# Patient Record
Sex: Female | Born: 1980 | Hispanic: No | Marital: Married | Smoking: Current every day smoker
Health system: Southern US, Community
[De-identification: ages and names within clinical notes are randomized; demographics above are authoritative.]

---

## 2013-04-20 ENCOUNTER — Encounter: Payer: Self-pay | Admitting: Emergency Medicine

## 2013-04-20 ENCOUNTER — Emergency Department (INDEPENDENT_AMBULATORY_CARE_PROVIDER_SITE_OTHER)
Admission: EM | Admit: 2013-04-20 | Discharge: 2013-04-20 | Disposition: A | Discharge status: Home / Self Care | Payer: Managed Care, Other (non HMO) | Source: Home / Self Care | Attending: Emergency Medicine | Admitting: Emergency Medicine

## 2013-04-20 ENCOUNTER — Emergency Department (INDEPENDENT_AMBULATORY_CARE_PROVIDER_SITE_OTHER): Payer: Managed Care, Other (non HMO)

## 2013-04-20 DIAGNOSIS — S63509A Unspecified sprain of unspecified wrist, initial encounter: Secondary | ICD-10-CM

## 2013-04-20 DIAGNOSIS — S60219A Contusion of unspecified wrist, initial encounter: Secondary | ICD-10-CM

## 2013-04-20 DIAGNOSIS — S60212A Contusion of left wrist, initial encounter: Secondary | ICD-10-CM

## 2013-04-20 DIAGNOSIS — M25539 Pain in unspecified wrist: Secondary | ICD-10-CM

## 2013-04-20 DIAGNOSIS — S63502A Unspecified sprain of left wrist, initial encounter: Secondary | ICD-10-CM

## 2013-04-20 NOTE — ED Notes (Signed)
Martha HugerLana reports she fell down steps today and injured her left arm and wrist. The pain is a sharp pain and with movement it is a 7/10.

## 2013-04-20 NOTE — ED Provider Notes (Signed)
CSN: [id number]     Arrival date & time 04/20/13  1551 History   First MD Initiated Contact with Patient 04/20/13 1705     Chief Complaint  Patient presents with  . Arm Injury    today  . Wrist Pain    today   (Consider location/radiation/quality/duration/timing/severity/associated sxs/prior Treatment) Patient is a 33 y.o. female presenting with arm injury and wrist pain. The history is provided by the patient.  Arm Injury Location:  Wrist Time since incident:  6 hours Injury: yes   Mechanism of injury: fall   Fall:    Fall occurred:  Tripped   Height of fall:  4 feet   Impact surface:  Hard floor   Point of impact:  Unable to specify   Entrapped after fall: no   Wrist location:  L wrist Pain details:    Quality:  Sharp   Radiates to:  Does not radiate   Severity:  Severe (7/10)   Onset quality:  Sudden   Timing:  Unable to specify   Progression:  Waxing and waning Chronicity:  New Foreign body present: No open wound. Relieved by:  Rest Worsened by:  Movement Associated symptoms: decreased range of motion and swelling   Associated symptoms: no back pain, no fever, no muscle weakness, no neck pain, no numbness, no stiffness and no tingling   Risk factors: no concern for non-accidental trauma, no known bone disorder and no recent illness   Wrist Pain    History reviewed. No pertinent past medical history. History reviewed. No pertinent past surgical history. History reviewed. No pertinent family history. History  Substance Use Topics  . Smoking status: Current Every Day Smoker -- 0.50 packs/day    Types: Cigarettes  . Smokeless tobacco: Never Used  . Alcohol Use: No   OB History   Grav Para Term Preterm Abortions TAB SAB Ect Mult Living                 Review of Systems  Constitutional: Negative for fever.  Musculoskeletal: Negative for back pain, neck pain and stiffness.    Allergies  Review of patient's allergies indicates no known allergies.  Home  Medications  No current outpatient prescriptions on file. BP 98/66  Pulse 68  Temp(Src) 98.4 F (36.9 C) (Oral)  Ht 5\' 4"  (1.626 m)  Wt 144 lb (65.318 kg)  BMI 24.71 kg/m2  SpO2 100%  LMP 04/09/2013 Physical Exam  Nursing note and vitals reviewed. Constitutional: She is oriented to person, place, and time. She appears well-developed and well-nourished. No distress.  HENT:  Head: Normocephalic and atraumatic.  Eyes: Conjunctivae and EOM are normal. Pupils are equal, round, and reactive to light. No scleral icterus.  Neck: Normal range of motion.  Cardiovascular: Normal rate.   Pulmonary/Chest: Effort normal.  Abdominal: She exhibits no distension.  Musculoskeletal:       Left elbow: Normal.       Left wrist: She exhibits decreased range of motion, tenderness, bony tenderness and swelling. She exhibits no deformity and no laceration.       Left forearm: Normal. She exhibits no tenderness, no swelling and no deformity.  Minimal swelling. 4+ tenderness distal ulna, exacerbated by flexion and extension and hand grip. No instability. Minimally tender to palpation proximal first metacarpal without deformity. No ecchymosis  Neurovascular distally intact  Neurological: She is alert and oriented to person, place, and time.  Skin: Skin is warm. No rash noted.  Psychiatric: She has a normal mood  and affect.    ED Course  Procedures (including critical care time) Labs Review Labs Reviewed - No data to display Imaging Review Dg Wrist Complete Left  04/20/2013   CLINICAL DATA:  ARM INJURY WRIST PAIN  EXAM: LEFT WRIST - COMPLETE 3+ VIEW  COMPARISON:  None.  FINDINGS: There is no evidence of fracture or dislocation. There is no evidence of arthropathy or other focal bone abnormality. Soft tissues are unremarkable.  IMPRESSION: Negative.   Electronically Signed   By: Elige Ko   On: 04/20/2013 17:37    EKG Interpretation    Date/Time:    Ventricular Rate:    PR Interval:    QRS  Duration:   QT Interval:    QTC Calculation:   R Axis:     Text Interpretation:              MDM   1. Left wrist sprain, initial encounter   2. Wrist contusion, left, initial encounter    Reviewed negative x-ray with patient. ACE, ice for one day. Elevate tonight Left wrist splint applied After resting for 2 days, gradual passive range of motion exercises. Ibuprofen when necessary mild to moderate pain. At her request for strong pain meds, #10 of Vicodin prescribed, precautions discussed. Followup with orthopedist if no better one week, sooner if worse or new symptoms. Precautions discussed. Red flags discussed. Questions invited and answered. Patient voiced understanding and agreement.     Lajean Manes, MD 04/20/13 [phone]

## 2014-06-07 IMAGING — CR DG WRIST COMPLETE 3+V*L*
2 series · 2 of 2 positions shown · non-contrast
Comparison: None.

CLINICAL DATA: ARM INJURY WRIST PAIN

EXAM:
LEFT WRIST - COMPLETE 3+ VIEW

[view not recorded (1 of 2)]
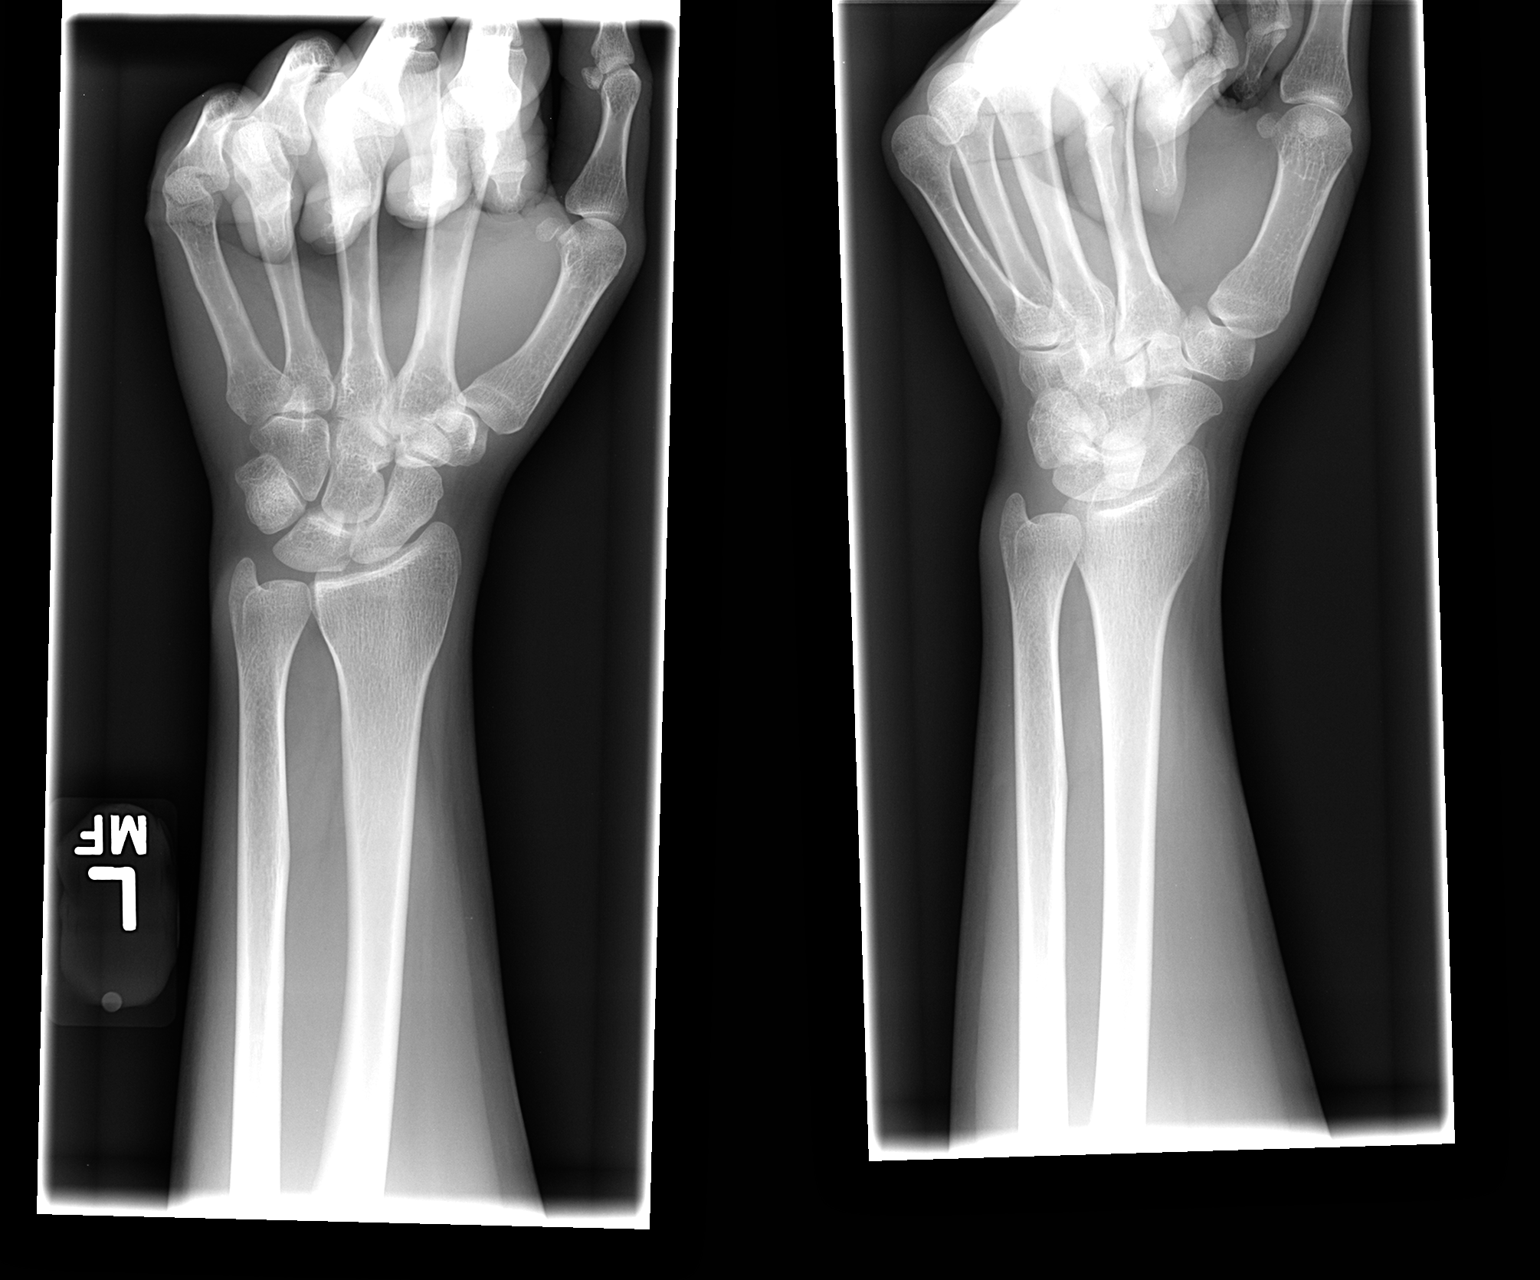

[view not recorded (2 of 2)]
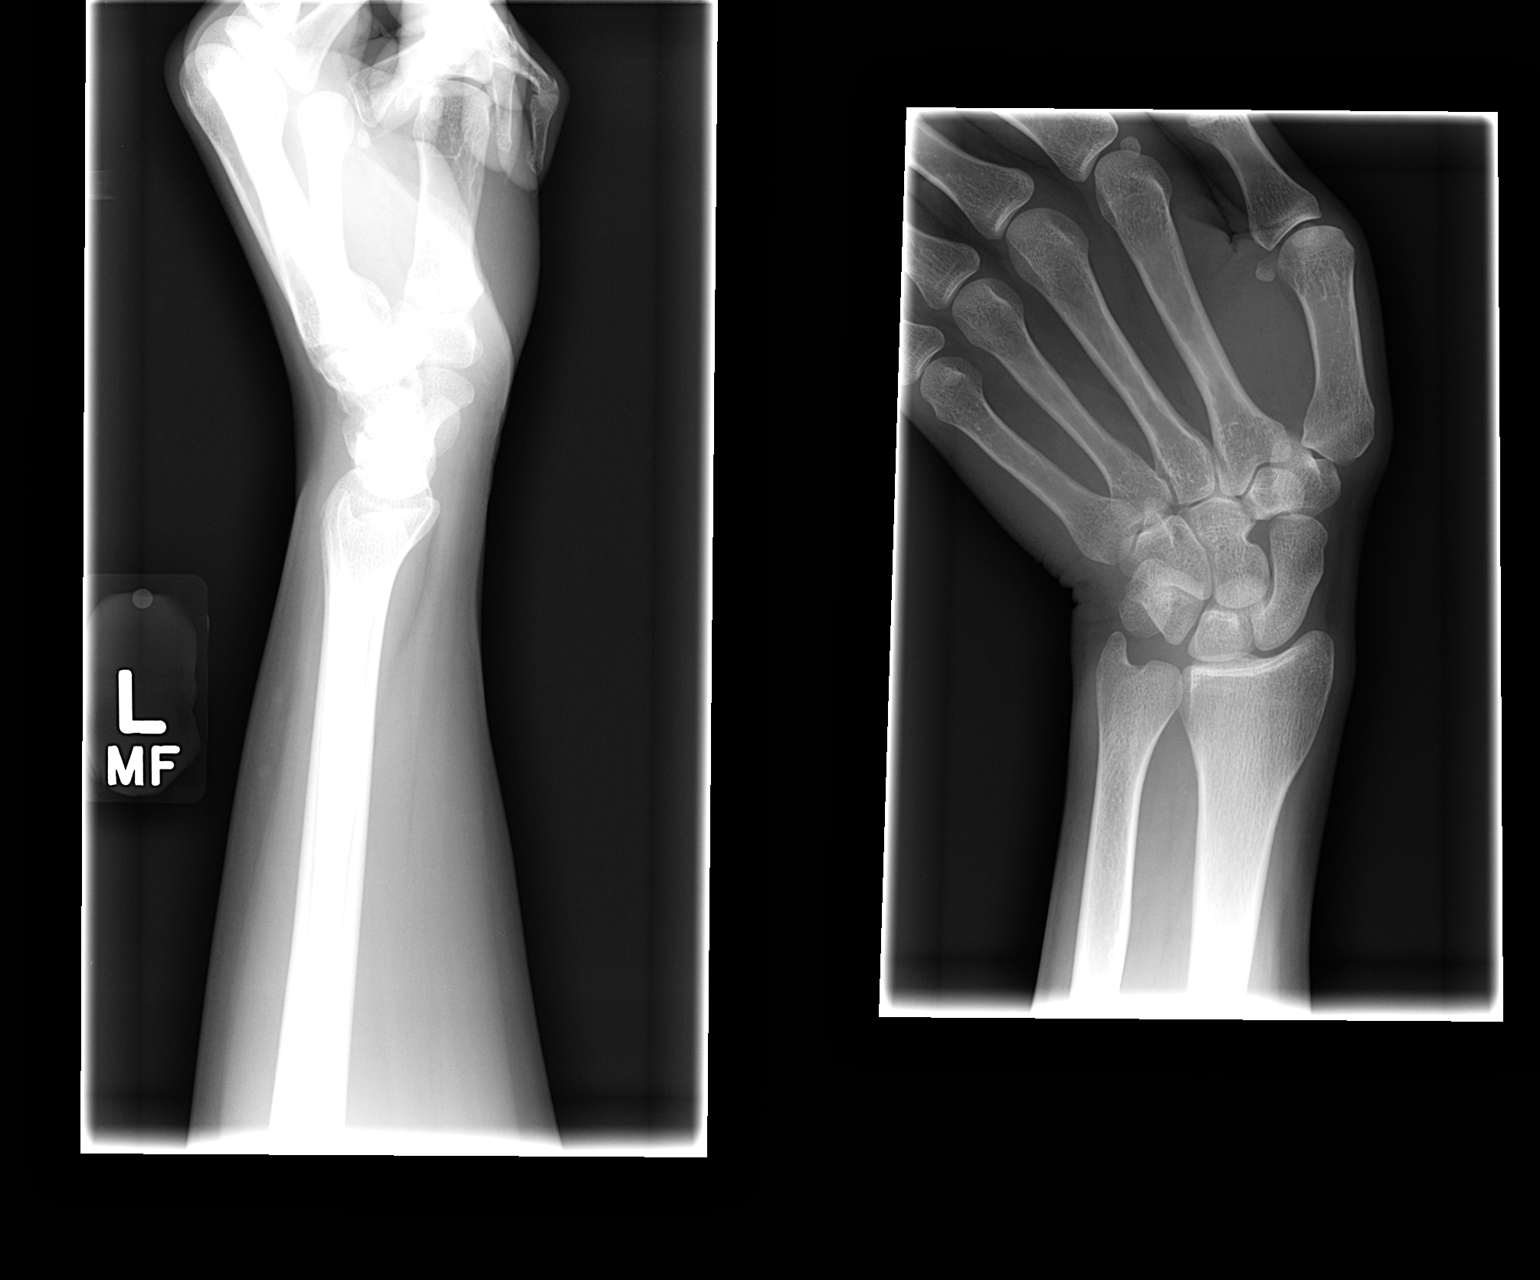

[2 of 2 positions shown; findings below may reference images not displayed]

FINDINGS: There is no evidence of fracture or dislocation. There is no
evidence of arthropathy or other focal bone abnormality. Soft
tissues are unremarkable.
IMPRESSION: Negative.
